# Patient Record
Sex: Female | Born: 2005 | Race: Black or African American | Hispanic: No | Marital: Single | Smoking: Never smoker
Health system: Southern US, Community
[De-identification: ages and names within clinical notes are randomized; demographics above are authoritative.]

## PROBLEM LIST (undated history)

## (undated) DIAGNOSIS — D573 Sickle-cell trait: Secondary | ICD-10-CM

## (undated) DIAGNOSIS — Z789 Other specified health status: Secondary | ICD-10-CM

## (undated) HISTORY — PX: OTHER SURGICAL HISTORY: SHX169

## (undated) HISTORY — DX: Sickle-cell trait: D57.3

## (undated) HISTORY — DX: Other specified health status: Z78.9

---
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
## 2023-04-04 DIAGNOSIS — Z113 Encounter for screening for infections with a predominantly sexual mode of transmission: Secondary | ICD-10-CM | POA: Diagnosis not present

## 2023-04-18 DIAGNOSIS — Z111 Encounter for screening for respiratory tuberculosis: Secondary | ICD-10-CM | POA: Diagnosis not present

## 2023-04-20 DIAGNOSIS — Z111 Encounter for screening for respiratory tuberculosis: Secondary | ICD-10-CM | POA: Diagnosis not present

## 2023-05-18 DIAGNOSIS — Z113 Encounter for screening for infections with a predominantly sexual mode of transmission: Secondary | ICD-10-CM | POA: Diagnosis not present

## 2023-06-30 DIAGNOSIS — N899 Noninflammatory disorder of vagina, unspecified: Secondary | ICD-10-CM | POA: Diagnosis not present

## 2023-06-30 DIAGNOSIS — Z30431 Encounter for routine checking of intrauterine contraceptive device: Secondary | ICD-10-CM | POA: Diagnosis not present

## 2023-06-30 DIAGNOSIS — Z1152 Encounter for screening for COVID-19: Secondary | ICD-10-CM | POA: Diagnosis not present

## 2023-06-30 DIAGNOSIS — J00 Acute nasopharyngitis [common cold]: Secondary | ICD-10-CM | POA: Diagnosis not present

## 2023-08-24 DIAGNOSIS — Z113 Encounter for screening for infections with a predominantly sexual mode of transmission: Secondary | ICD-10-CM | POA: Diagnosis not present

## 2023-09-11 ENCOUNTER — Ambulatory Visit: Payer: Self-pay | Admitting: Family Medicine

## 2023-09-19 ENCOUNTER — Ambulatory Visit: Payer: Medicaid Other | Attending: Family Medicine | Admitting: Family Medicine

## 2023-09-19 ENCOUNTER — Other Ambulatory Visit (HOSPITAL_COMMUNITY)
Admission: RE | Admit: 2023-09-19 | Discharge: 2023-09-19 | Disposition: A | Discharge status: Home / Self Care | Payer: Medicaid Other | Source: Ambulatory Visit | Attending: Family Medicine | Admitting: Family Medicine

## 2023-09-19 ENCOUNTER — Encounter: Payer: Self-pay | Admitting: Family Medicine

## 2023-09-19 VITALS — BP 97/62 | HR 66 | Ht 65.0 in | Wt 154.0 lb

## 2023-09-19 DIAGNOSIS — Z13228 Encounter for screening for other metabolic disorders: Secondary | ICD-10-CM

## 2023-09-19 DIAGNOSIS — Z113 Encounter for screening for infections with a predominantly sexual mode of transmission: Secondary | ICD-10-CM

## 2023-09-19 DIAGNOSIS — R7989 Other specified abnormal findings of blood chemistry: Secondary | ICD-10-CM | POA: Diagnosis not present

## 2023-09-19 DIAGNOSIS — Z0000 Encounter for general adult medical examination without abnormal findings: Secondary | ICD-10-CM

## 2023-09-19 NOTE — Progress Notes (Signed)
Subjective:  Patient ID: Madison Tyler, female    DOB: [birth year]  Age: 18 y.o. MRN: [id number]  CC: Annual Exam   HPI Madison Tyler is a 18 y.o. year old female here for an annual physical exam.  Interval History: Discussed the use of AI scribe software for clinical note transcription with the patient, who gave verbal consent to proceed.   She reports no major concerns and is trying to maintain a healthy lifestyle by eating a balanced diet and exercising regularly. She recently recovered from an upper respiratory infection, which is still causing a runny nose. She denies any other symptoms and has no new complaints. She has been regularly seeing a dentist and an optometrist, with the most recent visits in December and November, respectively.        Past Medical History:  Diagnosis Date   No pertinent past medical history     Past Surgical History:  Procedure Laterality Date   NECK CYST      No family history on file.  Social History   Socioeconomic History   Marital status: Single    Spouse name: Not on file   Number of children: Not on file   Years of education: Not on file   Highest education level: Not on file  Occupational History   Not on file  Tobacco Use   Smoking status: Never   Smokeless tobacco: Never  Vaping Use   Vaping status: Never Used  Substance and Sexual Activity   Alcohol use: Never   Drug use: Never   Sexual activity: Yes    Birth control/protection: I.U.D.  Other Topics Concern   Not on file  Social History Narrative   Not on file   Social Drivers of Health   Financial Resource Strain: Not on file  Food Insecurity: No Food Insecurity (02/08/2022)   Hunger Vital Sign    Worried About Running Out of Food in the Last Year: Never true    Ran Out of Food in the Last Year: Never true  Transportation Needs: No Transportation Needs (02/08/2022)   PRAPARE - Administrator, Civil Service (Medical): No    Lack of Transportation  (Non-Medical): No  Physical Activity: Not on file  Stress: Not on file  Social Connections: Not on file    No Known Allergies  Outpatient Medications Prior to Visit  Medication Sig Dispense Refill   doxycycline (VIBRA-TABS) 100 MG tablet Take 1 tablet (100 mg total) by mouth 2 (two) times daily. 14 tablet 0   Facility-Administered Medications Prior to Visit  Medication Dose Route Frequency Provider Last Rate Last Admin   ibuprofen (ADVIL) tablet 800 mg  800 mg Oral Once Worthy Rancher, MD         ROS Review of Systems  Constitutional:  Negative for activity change and appetite change.  HENT:  Negative for sinus pressure and sore throat.   Respiratory:  Negative for chest tightness, shortness of breath and wheezing.   Cardiovascular:  Negative for chest pain and palpitations.  Gastrointestinal:  Negative for abdominal distention, abdominal pain and constipation.  Genitourinary: Negative.   Musculoskeletal: Negative.   Psychiatric/Behavioral:  Negative for behavioral problems and dysphoric mood.     Objective:  BP 97/62   Pulse 66   Ht 5\' 5"  (1.651 m)   Wt 154 lb (69.9 kg)   SpO2 97%   BMI 25.63 kg/m      09/19/2023   10:15 AM 09/08/2022  8:47 AM 02/08/2022    4:17 PM  BP/Weight  Systolic BP 97 95 98  Diastolic BP 62 58 54  Wt. (Lbs) 154 158 150.4  BMI 25.63 kg/m2 26.29 kg/m2       Physical Exam Constitutional:      General: She is not in acute distress.    Appearance: She is well-developed. She is not diaphoretic.  HENT:     Head: Normocephalic.     Right Ear: External ear normal.     Left Ear: External ear normal.     Nose: Nose normal.     Mouth/Throat:     Mouth: Mucous membranes are moist.  Eyes:     Extraocular Movements: Extraocular movements intact.     Conjunctiva/sclera: Conjunctivae normal.     Pupils: Pupils are equal, round, and reactive to light.  Neck:     Vascular: No JVD.  Cardiovascular:     Rate and Rhythm: Normal rate and  regular rhythm.     Pulses: Normal pulses.     Heart sounds: Normal heart sounds. No murmur heard.    No gallop.  Pulmonary:     Effort: Pulmonary effort is normal. No respiratory distress.     Breath sounds: Normal breath sounds. No wheezing or rales.  Chest:     Chest wall: No tenderness.  Abdominal:     General: Bowel sounds are normal. There is no distension.     Palpations: Abdomen is soft. There is no mass.     Tenderness: There is no abdominal tenderness.  Musculoskeletal:        General: No tenderness. Normal range of motion.     Cervical back: Normal range of motion.  Skin:    General: Skin is warm and dry.  Neurological:     Mental Status: She is alert and oriented to person, place, and time.     Deep Tendon Reflexes: Reflexes are normal and symmetric.  Psychiatric:        Mood and Affect: Mood normal.        Latest Ref Rng & Units 09/08/2022    9:22 AM  CMP  Glucose 70 - 99 mg/dL 83   BUN 6 - 20 mg/dL 10   Creatinine 9.81 - 1.00 mg/dL 1.91   Sodium 478 - 295 mmol/L 140   Potassium 3.5 - 5.2 mmol/L 4.1   Chloride 96 - 106 mmol/L 102   CO2 20 - 29 mmol/L 23   Calcium 8.7 - 10.2 mg/dL 9.4   Total Protein 6.0 - 8.5 g/dL 7.1   Total Bilirubin 0.0 - 1.2 mg/dL 0.2   Alkaline Phos 42 - 106 IU/L 80   AST 0 - 40 IU/L 15   ALT 0 - 32 IU/L 5     Lipid Panel     Component Value Date/Time   CHOL 169 09/08/2022 0922   TRIG 44 09/08/2022 0922   HDL 73 09/08/2022 0922   LDLCALC 87 09/08/2022 0922    CBC    Component Value Date/Time   WBC 6.3 09/08/2022 0922   RBC 4.21 09/08/2022 0922   HGB 12.1 09/08/2022 0922   HCT 35.8 09/08/2022 0922   PLT 283 09/08/2022 0922   MCV 85 09/08/2022 0922   MCH 28.7 09/08/2022 0922   MCHC 33.8 09/08/2022 0922   RDW 13.2 09/08/2022 0922   LYMPHSABS 2.2 09/08/2022 0922   EOSABS 0.1 09/08/2022 0922   BASOSABS 0.0 09/08/2022 6213    Lab Results  Component Value  Date   HGBA1C 5.4 09/08/2022    Assessment & Plan:       Annual Physical Exam No major concerns reported. Normal physical examination. -Order routine blood work to check kidney, liver, blood count, cholesterol, and diabetes screening. -Perform chlamydia screen. -Counseled on 150 minutes of exercise per week, healthy eating (including decreased daily intake of saturated fats, cholesterol, added sugars, sodium), routine healthcare maintenance.  General Health Maintenance -Declined flu shot. -Next annual physical exam in one year.          No orders of the defined types were placed in this encounter.   Follow-up: Return in about 1 year (around 09/18/2024) for CPE/ Preventive Health Exam.       Hoy Register, MD, FAAFP. [facility] [place], [place] [phone]   09/19/2023, 10:39 AM

## 2023-09-19 NOTE — Patient Instructions (Signed)

## 2023-09-20 ENCOUNTER — Other Ambulatory Visit: Payer: Self-pay | Admitting: Family Medicine

## 2023-09-20 DIAGNOSIS — R7989 Other specified abnormal findings of blood chemistry: Secondary | ICD-10-CM

## 2023-09-20 LAB — LP+NON-HDL CHOLESTEROL
Cholesterol, Total: 161 mg/dL (ref 100–169)
HDL: 70 mg/dL (ref >39)
LDL Chol Calc (NIH): 83 mg/dL (ref 0–109)
Total Non-HDL-Chol (LDL+VLDL): 91 mg/dL (ref 0–119)
Triglycerides: 36 mg/dL (ref 0–89)
VLDL Cholesterol Cal: 8 mg/dL (ref 5–40)

## 2023-09-20 LAB — CBC WITH DIFFERENTIAL/PLATELET
Basophils Absolute: 0 10*3/uL (ref 0.0–0.2)
Basos: 1 %
EOS (ABSOLUTE): 0.1 10*3/uL (ref 0.0–0.4)
Eos: 3 %
Hematocrit: 39.6 % (ref 34.0–46.6)
Hemoglobin: 12.7 g/dL (ref 11.1–15.9)
Immature Grans (Abs): 0 10*3/uL (ref 0.0–0.1)
Immature Granulocytes: 0 %
Lymphocytes Absolute: 2.2 10*3/uL (ref 0.7–3.1)
Lymphs: 44 %
MCH: 27.6 pg (ref 26.6–33.0)
MCHC: 32.1 g/dL (ref 31.5–35.7)
MCV: 86 fL (ref 79–97)
Monocytes Absolute: 0.4 10*3/uL (ref 0.1–0.9)
Monocytes: 7 %
Neutrophils Absolute: 2.2 10*3/uL (ref 1.4–7.0)
Neutrophils: 45 %
Platelets: 243 10*3/uL (ref 150–450)
RBC: 4.6 x10E6/uL (ref 3.77–5.28)
RDW: 13 % (ref 11.7–15.4)
WBC: 4.9 10*3/uL (ref 3.4–10.8)

## 2023-09-20 LAB — CMP14+EGFR
ALT: 146 [IU]/L — ABNORMAL HIGH (ref 0–32)
AST: 276 [IU]/L — ABNORMAL HIGH (ref 0–40)
Albumin: 4.7 g/dL (ref 4.0–5.0)
Alkaline Phosphatase: 80 [IU]/L (ref 42–106)
BUN/Creatinine Ratio: 9 (ref 9–23)
BUN: 8 mg/dL (ref 6–20)
Bilirubin Total: 0.5 mg/dL (ref 0.0–1.2)
CO2: 25 mmol/L (ref 20–29)
Calcium: 9.9 mg/dL (ref 8.7–10.2)
Chloride: 104 mmol/L (ref 96–106)
Creatinine, Ser: 0.88 mg/dL (ref 0.57–1.00)
Globulin, Total: 2.8 g/dL (ref 1.5–4.5)
Glucose: 81 mg/dL (ref 70–99)
Potassium: 4.3 mmol/L (ref 3.5–5.2)
Sodium: 142 mmol/L (ref 134–144)
Total Protein: 7.5 g/dL (ref 6.0–8.5)
eGFR: 97 mL/min/{1.73_m2} (ref >59)

## 2023-09-20 LAB — HEMOGLOBIN A1C
Est. average glucose Bld gHb Est-mCnc: 105 mg/dL
Hgb A1c MFr Bld: 5.3 % (ref 4.8–5.6)

## 2023-09-21 ENCOUNTER — Other Ambulatory Visit: Payer: Self-pay | Admitting: Family Medicine

## 2023-09-21 DIAGNOSIS — R7989 Other specified abnormal findings of blood chemistry: Secondary | ICD-10-CM

## 2023-09-21 LAB — CERVICOVAGINAL ANCILLARY ONLY
Bacterial Vaginitis (gardnerella): POSITIVE — AB
Candida Glabrata: NEGATIVE
Candida Vaginitis: NEGATIVE
Chlamydia: NEGATIVE
Comment: NEGATIVE
Comment: NEGATIVE
Comment: NEGATIVE
Comment: NEGATIVE
Comment: NEGATIVE
Comment: NORMAL
Neisseria Gonorrhea: NEGATIVE
Trichomonas: NEGATIVE

## 2023-09-22 ENCOUNTER — Other Ambulatory Visit: Payer: Self-pay | Admitting: Family Medicine

## 2023-09-22 LAB — ALPHA-1-ANTITRYPSIN: A-1 Antitrypsin: 151 mg/dL (ref 100–188)

## 2023-09-22 LAB — SPECIMEN STATUS REPORT

## 2023-09-22 LAB — MITOCHONDRIAL ANTIBODIES: Mitochondrial Ab: <20 U (ref 0.0–20.0)

## 2023-09-22 LAB — HEPATITIS A ANTIBODY, IGM: Hep A IgM: NEGATIVE

## 2023-09-22 LAB — HEPATITIS A ANTIBODY, TOTAL: hep A Total Ab: POSITIVE — AB

## 2023-09-22 LAB — CERULOPLASMIN: Ceruloplasmin: 28.8 mg/dL (ref 19.0–39.0)

## 2023-09-22 LAB — HEPATITIS B SURFACE ANTIGEN: Hepatitis B Surface Ag: NEGATIVE

## 2023-09-22 MED ORDER — METRONIDAZOLE 500 MG PO TABS
500.0000 mg | ORAL_TABLET | Freq: Two times a day (BID) | ORAL | 0 refills | Status: AC
Start: 2023-09-22 — End: 2023-09-29

## 2023-12-06 DIAGNOSIS — Z113 Encounter for screening for infections with a predominantly sexual mode of transmission: Secondary | ICD-10-CM | POA: Diagnosis not present

## 2023-12-06 DIAGNOSIS — J029 Acute pharyngitis, unspecified: Secondary | ICD-10-CM | POA: Diagnosis not present

## 2024-02-20 ENCOUNTER — Telehealth: Payer: Self-pay | Admitting: Family Medicine

## 2024-02-20 NOTE — Telephone Encounter (Signed)
 2nd attempt Contacted pt phone going to vm to confirmed appt

## 2024-02-20 NOTE — Telephone Encounter (Signed)
 Contacted pt both numbers on file left vm to confirmed appt

## 2024-02-21 ENCOUNTER — Other Ambulatory Visit (HOSPITAL_COMMUNITY)
Admission: RE | Admit: 2024-02-21 | Discharge: 2024-02-21 | Disposition: A | Discharge status: Home / Self Care | Source: Ambulatory Visit | Attending: Family Medicine | Admitting: Family Medicine

## 2024-02-21 ENCOUNTER — Encounter: Payer: Self-pay | Admitting: Family Medicine

## 2024-02-21 ENCOUNTER — Ambulatory Visit: Attending: Family Medicine | Admitting: Family Medicine

## 2024-02-21 VITALS — BP 105/67 | HR 55 | Ht 65.0 in | Wt 150.4 lb

## 2024-02-21 DIAGNOSIS — Z975 Presence of (intrauterine) contraceptive device: Secondary | ICD-10-CM | POA: Diagnosis not present

## 2024-02-21 DIAGNOSIS — R7989 Other specified abnormal findings of blood chemistry: Secondary | ICD-10-CM | POA: Diagnosis not present

## 2024-02-21 DIAGNOSIS — N760 Acute vaginitis: Secondary | ICD-10-CM | POA: Diagnosis not present

## 2024-02-21 DIAGNOSIS — R634 Abnormal weight loss: Secondary | ICD-10-CM

## 2024-02-21 DIAGNOSIS — B9689 Other specified bacterial agents as the cause of diseases classified elsewhere: Secondary | ICD-10-CM | POA: Insufficient documentation

## 2024-02-21 MED ORDER — METRONIDAZOLE 0.75 % VA GEL: VAGINAL | 6 refills | Status: AC

## 2024-02-21 NOTE — Patient Instructions (Signed)
 VISIT SUMMARY:  Today, we discussed your concerns about recurrent bacterial vaginosis (BV) since the insertion of your IUD, menstrual irregularities, recent weight loss, and slightly elevated liver enzymes. We also reviewed your general health maintenance.  YOUR PLAN:  -RECURRENT BACTERIAL VAGINOSIS (BV): Recurrent BV is a condition where there is an imbalance of bacteria in the vagina, often causing odor and discharge. This may be linked to your IUD. We will perform a swab test to check for BV, prescribe preventive treatment, and provide medication if the test is positive. You will also be referred to a gynecologist to evaluate your IUD-related symptoms.  -WEIGHT LOSS: You have lost 4 pounds since January, likely due to stress and irregular eating habits at school. Your recent lab results showed normal kidney function, slightly elevated liver enzymes, and normal cholesterol and thyroid function. We will repeat these tests and discuss potential causes of the elevated liver enzymes. Please set up MyChart for easier communication and follow-up.  -ELEVATED LIVER ENZYMES: Slightly elevated liver enzymes can be caused by various factors including alcohol consumption, fatty liver, hepatitis, or medication use. We will repeat the liver function tests and monitor your alcohol intake, advising moderation.  -GENERAL HEALTH MAINTENANCE: We will screen for yeast and fungal infections as part of your BV evaluation. Routine labs showed normal kidney function, normal cholesterol, and thyroid function. Please continue to maintain a healthy lifestyle.  INSTRUCTIONS:  Please follow up with the gynecologist for your IUD-related symptoms and set up MyChart for easier communication and follow-up. Repeat labs for kidney function, liver function, cholesterol, and thyroid levels will be ordered. Monitor your alcohol consumption and maintain a healthy diet to help manage stress and weight.

## 2024-02-21 NOTE — Progress Notes (Signed)
 Subjective:  Patient ID: Madison Tyler, female    DOB: March 11, 2006  Age: 18 y.o. MRN: [id number]  CC: Medical Management of Chronic Issues (Discuss trouble with IUD/Irregular periods)     Discussed the use of AI scribe software for clinical note transcription with the patient, who gave verbal consent to proceed.  History of Present Illness Madison Tyler is a 18 year old female who presents with concerns related to her IUD. She is accompanied by her mother.  She experiences recurrent bacterial vaginosis since the insertion of her IUD two to two and a half years ago, with frequent infections characterized by changes in odor and discharge. She typically undergoes treatment with medication for one to two weeks and uses suppositories monthly.  Menstrual irregularities have occurred since the IUD insertion, with lighter but longer periods and spotting a few days before or after menstruation. Her cycle remains regular in timing, occurring monthly. She also experiences new onset cramping with her menstrual cycle.  There is recent weight loss attributed to stress and irregular eating habits at school, noted by family and friends. Her mother requests a blood workup to ensure stable health. She has a known sickle cell trait.    Past Medical History:  Diagnosis Date   No pertinent past medical history     Past Surgical History:  Procedure Laterality Date   NECK CYST      No family history on file.  Social History   Socioeconomic History   Marital status: Single    Spouse name: Not on file   Number of children: Not on file   Years of education: Not on file   Highest education level: Not on file  Occupational History   Not on file  Tobacco Use   Smoking status: Never   Smokeless tobacco: Never  Vaping Use   Vaping status: Never Used  Substance and Sexual Activity   Alcohol use: Never   Drug use: Never   Sexual activity: Yes    Birth control/protection: I.U.D.  Other Topics  Concern   Not on file  Social History Narrative   Not on file   Social Drivers of Health   Financial Resource Strain: Not on file  Food Insecurity: No Food Insecurity (02/08/2022)   Hunger Vital Sign    Worried About Running Out of Food in the Last Year: Never true    Ran Out of Food in the Last Year: Never true  Transportation Needs: No Transportation Needs (02/08/2022)   PRAPARE - Administrator, Civil Service (Medical): No    Lack of Transportation (Non-Medical): No  Physical Activity: Not on file  Stress: Not on file  Social Connections: Not on file    No Known Allergies  Outpatient Medications Prior to Visit  Medication Sig Dispense Refill   doxycycline  (VIBRA -TABS) 100 MG tablet Take 1 tablet (100 mg total) by mouth 2 (two) times daily. (Patient not taking: Reported on 02/21/2024) 14 tablet 0   Facility-Administered Medications Prior to Visit  Medication Dose Route Frequency Provider Last Rate Last Admin   ibuprofen  (ADVIL ) tablet 800 mg  800 mg Oral Once Clem Tawni HERO, MD         ROS Review of Systems  Constitutional:  Negative for activity change and appetite change.  HENT:  Negative for sinus pressure and sore throat.   Respiratory:  Negative for chest tightness, shortness of breath and wheezing.   Cardiovascular:  Negative for chest pain and palpitations.  Gastrointestinal:  Negative for abdominal distention, abdominal pain and constipation.  Genitourinary:  Positive for menstrual problem.  Musculoskeletal: Negative.   Psychiatric/Behavioral:  Negative for behavioral problems and dysphoric mood.     Objective:  BP 105/67   Pulse (!) 55   Ht 5' 5 (1.651 m)   Wt 150 lb 6.4 oz (68.2 kg)   LMP 02/13/2024   SpO2 99%   BMI 25.03 kg/m      02/21/2024    2:01 PM 09/19/2023   10:15 AM 09/08/2022    8:47 AM  BP/Weight  Systolic BP 105 97 95  Diastolic BP 67 62 58  Wt. (Lbs) 150.4 154 158  BMI 25.03 kg/m2 25.63 kg/m2 26.29 kg/m2       Physical Exam Constitutional:      Appearance: She is well-developed.  Cardiovascular:     Rate and Rhythm: Normal rate.     Heart sounds: Normal heart sounds. No murmur heard. Pulmonary:     Effort: Pulmonary effort is normal.     Breath sounds: Normal breath sounds. No wheezing or rales.  Chest:     Chest wall: No tenderness.  Abdominal:     General: Bowel sounds are normal. There is no distension.     Palpations: Abdomen is soft. There is no mass.     Tenderness: There is no abdominal tenderness.  Musculoskeletal:        General: Normal range of motion.     Right lower leg: No edema.     Left lower leg: No edema.  Neurological:     Mental Status: She is alert and oriented to person, place, and time.  Psychiatric:        Mood and Affect: Mood normal.        Latest Ref Rng & Units 09/19/2023   10:50 AM 09/08/2022    9:22 AM  CMP  Glucose 70 - 99 mg/dL 81  83   BUN 6 - 20 mg/dL 8  10   Creatinine 9.42 - 1.00 mg/dL 9.11  8.99   Sodium 865 - 144 mmol/L 142  140   Potassium 3.5 - 5.2 mmol/L 4.3  4.1   Chloride 96 - 106 mmol/L 104  102   CO2 20 - 29 mmol/L 25  23   Calcium 8.7 - 10.2 mg/dL 9.9  9.4   Total Protein 6.0 - 8.5 g/dL 7.5  7.1   Total Bilirubin 0.0 - 1.2 mg/dL 0.5  0.2   Alkaline Phos 42 - 106 IU/L 80  80   AST 0 - 40 IU/L 276  15   ALT 0 - 32 IU/L 146  5     Lipid Panel     Component Value Date/Time   CHOL 161 09/19/2023 1050   TRIG 36 09/19/2023 1050   HDL 70 09/19/2023 1050   LDLCALC 83 09/19/2023 1050    CBC    Component Value Date/Time   WBC 4.9 09/19/2023 1050   RBC 4.60 09/19/2023 1050   HGB 12.7 09/19/2023 1050   HCT 39.6 09/19/2023 1050   PLT 243 09/19/2023 1050   MCV 86 09/19/2023 1050   MCH 27.6 09/19/2023 1050   MCHC 32.1 09/19/2023 1050   RDW 13.0 09/19/2023 1050   LYMPHSABS 2.2 09/19/2023 1050   EOSABS 0.1 09/19/2023 1050   BASOSABS 0.0 09/19/2023 1050    Lab Results  Component Value Date   HGBA1C 5.3 09/19/2023     No results found for: TSH     Assessment &  Plan 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 Previous treatments included medication, yogurt, and suppositories. Uncertain about IUD removal due to convenience and contraceptive needs. Preventive treatment plan suggested with gynecology referral. - Perform wet prep swab for BV infection. - Prescribe prophylactic BV treatment. - Provide prescription for active BV treatment if swab positive. - Refer to gynecology for IUD-related symptoms evaluation.  Weight Loss Weight loss of 4 pounds since January, current weight 150 pounds. Labs showed normal kidney function, slightly elevated liver enzymes, normal cholesterol, and thyroid function. Stress and poor eating habits during school may contribute. Sickle cell trait present, not a concern unless partner also has the trait. - Order repeat labs for kidney function, liver function, cholesterol, and thyroid levels. - Discuss potential causes of elevated liver enzymes. - Advise on setting up MyChart for communication and follow-up.  Elevated Liver Enzymes Slightly elevated liver enzymes in January with no identified cause. Potential causes include alcohol consumption, fatty liver, hepatitis, and medication use. Reduced alcohol intake after admission of consumption. Previous tests for autoimmune disorders and hepatitis negative. -Patient endorses alcohol consumption - Repeat liver function tests. - Monitor alcohol consumption and advise moderation.  General Health Maintenance Plans to screen for yeast and fungal infections as part of BV evaluation. - Perform wet prep to screen for yeast and fungal infections.    Meds ordered this encounter  Medications   metroNIDAZOLE  (METROGEL ) 0.75 % vaginal gel    Sig: Insert twice a week for 6 months    Dispense:  70 g    Refill:  6    Follow-up: Return for previously scheduled appointment.        Corrina Sabin, MD, FAAFP. [facility] [place], [place] [phone]   02/21/2024, 2:38 PM

## 2024-02-22 ENCOUNTER — Ambulatory Visit: Payer: Self-pay | Admitting: Family Medicine

## 2024-02-22 LAB — CERVICOVAGINAL ANCILLARY ONLY
Bacterial Vaginitis (gardnerella): NEGATIVE
Candida Glabrata: NEGATIVE
Candida Vaginitis: NEGATIVE
Chlamydia: NEGATIVE
Comment: NEGATIVE
Comment: NEGATIVE
Comment: NEGATIVE
Comment: NEGATIVE
Comment: NEGATIVE
Comment: NORMAL
Neisseria Gonorrhea: NEGATIVE
Trichomonas: NEGATIVE

## 2024-02-22 LAB — CBC WITH DIFFERENTIAL/PLATELET
Basophils Absolute: 0 10*3/uL (ref 0.0–0.2)
Basos: 1 %
EOS (ABSOLUTE): 0.1 10*3/uL (ref 0.0–0.4)
Eos: 1 %
Hematocrit: 40.8 % (ref 34.0–46.6)
Hemoglobin: 13.2 g/dL (ref 11.1–15.9)
Immature Grans (Abs): 0 10*3/uL (ref 0.0–0.1)
Immature Granulocytes: 0 %
Lymphocytes Absolute: 2.1 10*3/uL (ref 0.7–3.1)
Lymphs: 36 %
MCH: 28.9 pg (ref 26.6–33.0)
MCHC: 32.4 g/dL (ref 31.5–35.7)
MCV: 89 fL (ref 79–97)
Monocytes Absolute: 0.4 10*3/uL (ref 0.1–0.9)
Monocytes: 7 %
Neutrophils Absolute: 3.2 10*3/uL (ref 1.4–7.0)
Neutrophils: 55 %
Platelets: 268 10*3/uL (ref 150–450)
RBC: 4.57 x10E6/uL (ref 3.77–5.28)
RDW: 12.9 % (ref 11.7–15.4)
WBC: 5.8 10*3/uL (ref 3.4–10.8)

## 2024-02-22 LAB — TSH: TSH: 1.17 u[IU]/mL (ref 0.450–4.500)

## 2024-02-22 LAB — LP+NON-HDL CHOLESTEROL
Cholesterol, Total: 184 mg/dL — ABNORMAL HIGH (ref 100–169)
HDL: 82 mg/dL (ref >39)
LDL Chol Calc (NIH): 92 mg/dL (ref 0–109)
Total Non-HDL-Chol (LDL+VLDL): 102 mg/dL (ref 0–119)
Triglycerides: 48 mg/dL (ref 0–89)
VLDL Cholesterol Cal: 10 mg/dL (ref 5–40)

## 2024-02-22 LAB — CMP14+EGFR
ALT: 9 IU/L (ref 0–32)
AST: 17 IU/L (ref 0–40)
Albumin: 4.8 g/dL (ref 4.0–5.0)
Alkaline Phosphatase: 76 IU/L (ref 42–106)
BUN/Creatinine Ratio: 14 (ref 9–23)
BUN: 12 mg/dL (ref 6–20)
Bilirubin Total: 0.6 mg/dL (ref 0.0–1.2)
CO2: 23 mmol/L (ref 20–29)
Calcium: 10.8 mg/dL — ABNORMAL HIGH (ref 8.7–10.2)
Chloride: 100 mmol/L (ref 96–106)
Creatinine, Ser: 0.87 mg/dL (ref 0.57–1.00)
Globulin, Total: 2.9 g/dL (ref 1.5–4.5)
Glucose: 78 mg/dL (ref 70–99)
Potassium: 4.3 mmol/L (ref 3.5–5.2)
Sodium: 137 mmol/L (ref 134–144)
Total Protein: 7.7 g/dL (ref 6.0–8.5)
eGFR: 98 mL/min/{1.73_m2} (ref >59)

## 2024-02-22 LAB — T4, FREE: Free T4: 1.24 ng/dL (ref 0.93–1.60)

## 2024-02-22 LAB — T3: T3, Total: 99 ng/dL (ref 71–180)

## 2024-02-22 LAB — GAMMA GT: GGT: 11 IU/L (ref 0–60)

## 2024-05-08 ENCOUNTER — Encounter: Payer: Self-pay | Admitting: Radiology

## 2024-05-08 ENCOUNTER — Ambulatory Visit (INDEPENDENT_AMBULATORY_CARE_PROVIDER_SITE_OTHER): Payer: Self-pay | Admitting: Radiology

## 2024-05-08 VITALS — BP 112/56 | Ht 66.0 in | Wt 151.0 lb

## 2024-05-08 DIAGNOSIS — Z30018 Encounter for initial prescription of other contraceptives: Secondary | ICD-10-CM

## 2024-05-08 DIAGNOSIS — Z975 Presence of (intrauterine) contraceptive device: Secondary | ICD-10-CM

## 2024-05-08 DIAGNOSIS — N760 Acute vaginitis: Secondary | ICD-10-CM

## 2024-05-08 MED ORDER — FLUCONAZOLE 150 MG PO TABS: 150.0000 mg | ORAL_TABLET | ORAL | 0 refills | Status: AC

## 2024-05-08 MED ORDER — PHEXXI 1.8-1-0.4 % VA GEL: 5.0000 g | VAGINAL | 11 refills | Status: AC

## 2024-05-08 NOTE — Progress Notes (Signed)
      Subjective: Madison Tyler is a 18 y.o. female who complains of recurrent BV. Symptoms intermittent since Mirena  insertion 2023 (per patient). Now with vaginal discharge, odor, no itching.    Review of Systems  All other systems reviewed and are negative.   Past Medical History:  Diagnosis Date   No pertinent past medical history    Sickle cell trait (HCC)       Objective:  Today's Vitals   05/08/24 1118  BP: (!) 112/56  Weight: 151 lb (68.5 kg)  Height: 5' 6 (1.676 m)   Body mass index is 24.37 kg/m.   Physical Exam Vitals and nursing note reviewed. Exam conducted with a chaperone present.  Constitutional:      Appearance: Normal appearance. She is well-developed.  Pulmonary:     Effort: Pulmonary effort is normal.  Abdominal:     General: Abdomen is flat.     Palpations: Abdomen is soft.  Genitourinary:    General: Normal vulva.     Vagina: Vaginal discharge present. No erythema, bleeding or lesions.     Cervix: Normal. No discharge, friability, lesion or erythema.     Uterus: Normal.      Adnexa: Right adnexa normal and left adnexa normal.  Neurological:     Mental Status: She is alert.  Psychiatric:        Mood and Affect: Mood normal.        Thought Content: Thought content normal.        Judgment: Judgment normal.     Microscopic wet-mount exam shows hyphae.   Darice Hoit, CMA present for exam  Assessment:/Plan:  1. Recurrent vaginitis (Primary) - WET PREP FOR TRICH, YEAST, CLUE - SureSwab Mycoplasma/Ureaplasma, PCR - fluconazole  (DIFLUCAN ) 150 MG tablet; Take 1 tablet (150 mg total) by mouth every 3 (three) days.  Dispense: 2 tablet; Refill: 0  2. IUD (intrauterine device) in place  3. Encounter for initial prescription of other contraceptives - Lactic Ac-Citric Ac-Pot Bitart (PHEXXI ) 1.8-1-0.4 % GEL; Place 5 g vaginally as directed. Before intercourse  Dispense: 120 g; Refill: 11   Will contact patient with results of testing completed  today. Avoid intercourse until symptoms are resolved. Safe sex encouraged. Avoid the use of soaps or perfumed products in the peri area. Avoid tub baths and sitting in sweaty or wet clothing for prolonged periods of time.    Olita Takeshita B, NP 11:36 AM

## 2024-05-09 LAB — WET PREP FOR TRICH, YEAST, CLUE

## 2024-05-12 LAB — SURESWAB MYCOPLASMA/UREAPLASMA, PCR
M. hominis DNA: NOT DETECTED
MYCOPLASMA GENITALIUM, rRNA,TMA: NOT DETECTED
U. parvum DNA: NOT DETECTED
U. urealyticum DNA: NOT DETECTED

## 2024-05-14 ENCOUNTER — Ambulatory Visit: Payer: Self-pay | Admitting: Radiology

## 2024-09-17 ENCOUNTER — Telehealth: Payer: Self-pay | Admitting: Family Medicine

## 2024-09-17 NOTE — Telephone Encounter (Signed)
 Contacted pt left vm to confirmed appt

## 2024-09-18 ENCOUNTER — Ambulatory Visit: Payer: Medicaid Other | Attending: Family Medicine | Admitting: Family Medicine

## 2024-09-18 ENCOUNTER — Encounter: Payer: Self-pay | Admitting: Family Medicine

## 2024-09-18 VITALS — BP 103/66 | HR 79 | Temp 98.5°F | Ht 66.0 in | Wt 150.4 lb

## 2024-09-18 DIAGNOSIS — Z13220 Encounter for screening for lipoid disorders: Secondary | ICD-10-CM

## 2024-09-18 DIAGNOSIS — Z0001 Encounter for general adult medical examination with abnormal findings: Secondary | ICD-10-CM

## 2024-09-18 DIAGNOSIS — R5383 Other fatigue: Secondary | ICD-10-CM

## 2024-09-18 DIAGNOSIS — Z131 Encounter for screening for diabetes mellitus: Secondary | ICD-10-CM

## 2024-09-18 DIAGNOSIS — G4700 Insomnia, unspecified: Secondary | ICD-10-CM

## 2024-09-18 DIAGNOSIS — Z13228 Encounter for screening for other metabolic disorders: Secondary | ICD-10-CM

## 2024-09-18 MED ORDER — DICLOFENAC SODIUM 1 % EX GEL: 4.0000 g | Freq: Four times a day (QID) | CUTANEOUS | 1 refills | Status: AC

## 2024-09-18 NOTE — Progress Notes (Signed)
 "  Subjective:  Patient ID: Madison Tyler, female    DOB: Jul 23, 2006  Age: 19 y.o. MRN: [id number]  CC: Annual Exam (Right jaw pain X1 week.)     Discussed the use of AI scribe software for clinical note transcription with the patient, who gave verbal consent to proceed.  History of Present Illness Madison Tyler is a 19 year old female who presents for an annual physical exam. She sees a dentist regularly and recently had an ophthalmology visit.  She has had R jaw pain for a few days, worse when opening her mouth past a certain point, without a clicking sound on side-to-side movement. She denies jaw trauma.  She has difficulty falling asleep due to racing thoughts and feels low energy and sluggish despite what seems like adequate sleep. She does not drink soda or coffee, has not tried melatonin, and does not exercise regularly.  She previously had elevated liver enzymes in January that normalized on repeat testing in July, in the setting of alcohol use at that time.  Her family history includes a possible case of diabetes in her grandfather.  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    Past Medical History:  Diagnosis Date   No pertinent past medical history    Sickle cell trait     Past Surgical History:  Procedure Laterality Date   NECK CYST      No family history on file.  Social History   Socioeconomic History   Marital status: Single    Spouse name: Not on file   Number of children: Not on file   Years of education: Not on file   Highest education level: Not on file  Occupational History   Not on file  Tobacco Use   Smoking status: Never    Passive exposure: Never   Smokeless tobacco: Never  Vaping Use   Vaping status: Never Used  Substance and Sexual Activity   Alcohol use: Yes    Alcohol/week: 3.0 standard drinks of alcohol    Types: 3 Standard drinks or equivalent per week   Drug use: Never    Sexual activity: Not Currently    Partners: Male    Birth control/protection: I.U.D.    Comment: menarche 19yo, sexual debut 19yo  Other Topics Concern   Not on file  Social History Narrative   Not on file   Social Drivers of Health   Tobacco Use: Low Risk (07/12/2024)   Received from [facility]   Patient History    Smoking Tobacco Use: Never    Smokeless Tobacco Use: Never    Passive Exposure: Not on file  Financial Resource Strain: Not on file  Food Insecurity: No Food Insecurity (02/08/2022)   Hunger Vital Sign    Worried About Running Out of Food in the Last Year: Never true    Ran Out of Food in the Last Year: Never true  Transportation Needs: No Transportation Needs (02/08/2022)   PRAPARE - Administrator, Civil Service (Medical): No    Lack of Transportation (Non-Medical): No  Physical Activity: Not on file  Stress: Not on file  Social Connections: Not on file  Depression (EYV7-0): Medium Risk (09/18/2024)   Depression (PHQ2-9)    PHQ-2 Score: 5  Alcohol Screen: Not on file  Housing: Not on file  Utilities: Not on file  Health Literacy: Not on file    Allergies[1]  Outpatient Medications Prior to Visit  Medication  Sig Dispense Refill   doxycycline  (VIBRA -TABS) 100 MG tablet Take 1 tablet (100 mg total) by mouth 2 (two) times daily. (Patient not taking: Reported on 09/18/2024) 14 tablet 0   fluconazole  (DIFLUCAN ) 150 MG tablet Take 1 tablet (150 mg total) by mouth every 3 (three) days. (Patient not taking: Reported on 09/18/2024) 2 tablet 0   Lactic Ac-Citric Ac-Pot Bitart (PHEXXI ) 1.8-1-0.4 % GEL Place 5 g vaginally as directed. Before intercourse (Patient not taking: Reported on 09/18/2024) 120 g 11   metroNIDAZOLE  (METROGEL ) 0.75 % vaginal gel Insert twice a week for 6 months (Patient not taking: Reported on 09/18/2024) 70 g 6   ibuprofen  (ADVIL ) tablet 800 mg      No facility-administered medications prior to visit.     ROS Review of Systems   Constitutional:  Negative for activity change and appetite change.  HENT:  Negative for sinus pressure and sore throat.   Respiratory:  Negative for chest tightness, shortness of breath and wheezing.   Cardiovascular:  Negative for chest pain and palpitations.  Gastrointestinal:  Negative for abdominal distention, abdominal pain and constipation.  Genitourinary: Negative.   Musculoskeletal: Negative.   Psychiatric/Behavioral:  Negative for behavioral problems and dysphoric mood.     Objective:  BP 103/66   Pulse 79   Temp 98.5 F (36.9 C) (Oral)   Ht 5' 6 (1.676 m)   Wt 150 lb 6.4 oz (68.2 kg)   SpO2 98%   BMI 24.28 kg/m      09/18/2024   10:36 AM 05/08/2024   11:18 AM 02/21/2024    2:01 PM  BP/Weight  Systolic BP 103 112 105  Diastolic BP 66 56 67  Wt. (Lbs) 150.4 151 150.4  BMI 24.28 kg/m2 24.37 kg/m2 25.03 kg/m2      Physical Exam Constitutional:      General: She is not in acute distress.    Appearance: She is well-developed. She is not diaphoretic.  HENT:     Head: Normocephalic.     Comments: No TTP of both jaws or TMJ and no clicking heard    Right Ear: External ear normal.     Left Ear: External ear normal.     Nose: Nose normal.     Mouth/Throat:     Mouth: Mucous membranes are moist.  Eyes:     Extraocular Movements: Extraocular movements intact.     Conjunctiva/sclera: Conjunctivae normal.     Pupils: Pupils are equal, round, and reactive to light.  Neck:     Vascular: No JVD.  Cardiovascular:     Rate and Rhythm: Normal rate and regular rhythm.     Pulses: Normal pulses.     Heart sounds: Normal heart sounds. No murmur heard.    No gallop.  Pulmonary:     Effort: Pulmonary effort is normal. No respiratory distress.     Breath sounds: Normal breath sounds. No wheezing or rales.  Chest:     Chest wall: No tenderness.  Abdominal:     General: Bowel sounds are normal. There is no distension.     Palpations: Abdomen is soft. There is no mass.      Tenderness: There is no abdominal tenderness.  Musculoskeletal:        General: No tenderness. Normal range of motion.     Cervical back: Normal range of motion.  Skin:    General: Skin is warm and dry.  Neurological:     Mental Status: She is alert and oriented to  person, place, and time.     Deep Tendon Reflexes: Reflexes are normal and symmetric.  Psychiatric:        Mood and Affect: Mood normal.        Latest Ref Rng & Units 02/21/2024    2:49 PM 09/19/2023   10:50 AM 09/08/2022    9:22 AM  CMP  Glucose 70 - 99 mg/dL 78  81  83   BUN 6 - 20 mg/dL 12  8  10    Creatinine 0.57 - 1.00 mg/dL 9.12  9.11  8.99   Sodium 134 - 144 mmol/L 137  142  140   Potassium 3.5 - 5.2 mmol/L 4.3  4.3  4.1   Chloride 96 - 106 mmol/L 100  104  102   CO2 20 - 29 mmol/L 23  25  23    Calcium 8.7 - 10.2 mg/dL 89.1  9.9  9.4   Total Protein 6.0 - 8.5 g/dL 7.7  7.5  7.1   Total Bilirubin 0.0 - 1.2 mg/dL 0.6  0.5  0.2   Alkaline Phos 42 - 106 IU/L 76  80  80   AST 0 - 40 IU/L 17  276  15   ALT 0 - 32 IU/L 9  146  5     Lipid Panel     Component Value Date/Time   CHOL 184 (H) 02/21/2024 1449   TRIG 48 02/21/2024 1449   HDL 82 02/21/2024 1449   LDLCALC 92 02/21/2024 1449    CBC    Component Value Date/Time   WBC 5.8 02/21/2024 1449   RBC 4.57 02/21/2024 1449   HGB 13.2 02/21/2024 1449   HCT 40.8 02/21/2024 1449   PLT 268 02/21/2024 1449   MCV 89 02/21/2024 1449   MCH 28.9 02/21/2024 1449   MCHC 32.4 02/21/2024 1449   RDW 12.9 02/21/2024 1449   LYMPHSABS 2.1 02/21/2024 1449   EOSABS 0.1 02/21/2024 1449   BASOSABS 0.0 02/21/2024 1449    Lab Results  Component Value Date   HGBA1C 5.3 09/19/2023       Assessment & Plan Annual visit for adult general medical exam with abnormal findings/screening for metabolic disorder, diabetes, lipid for cardiovascular disease Annual wellness visit completed. Previous labs normal. No sexual activity reported. - Ordered labs for cholesterol,  diabetes, thyroid , and vitamin D  levels. - Scheduled next physical exam in one year.  Temporomandibular joint dysfunction Jaw pain with clicking likely due to TMJ dysfunction causing inflammation. - Prescribed Voltaren  gel for inflammation. - Provided information on TMJ dysfunction.  Sleep disturbance Difficulty falling asleep possibly due to anxiety. Discussed non-pharmacological interventions and advised against long-term medication use. - Recommended chamomile tea, yoga, and evening exercise. - Suggested over-the-counter melatonin.  Fatigue Low energy despite adequate sleep. Previous thyroid  function normal. Plan to check vitamin D  and screen for anemia. - Ordered vitamin D  level and anemia screening. - Recommended multivitamins. - Advised yoga for relaxation and energy improvement.      Meds ordered this encounter  Medications   diclofenac  Sodium (VOLTAREN ) 1 % GEL    Sig: Apply 4 g topically 4 (four) times daily.    Dispense:  100 g    Refill:  1    Follow-up: Return in about 1 year (around 09/18/2025) for CPE/ Preventive Health Exam.       Corrina Sabin, MD, FAAFP. [facility] [place], [place] [phone]   09/18/2024, 12:06 PM    [1] No Known Allergies  "

## 2024-09-18 NOTE — Patient Instructions (Signed)
 Temporomandibular Joint Syndrome  Temporomandibular joint syndrome (TMJ syndrome) is a condition that causes pain in the temporomandibular joints. These joints are located near your ears and allow your jaw to open and close. For people with TMJ syndrome, chewing, biting, or other movements of the jaw can be difficult or painful. TMJ syndrome is often mild and goes away within a few weeks. However, sometimes the condition becomes a long-term (chronic) problem. What are the causes? This condition may be caused by: Grinding your teeth or clenching your jaw. Some people do this when they are stressed. Arthritis. An injury to the jaw. A head or neck injury. Teeth or dentures that are not aligned well. In some cases, the cause of TMJ syndrome may not be known. What are the signs or symptoms? The most common symptom of this condition is aching pain on the side of the head in the area of the TMJ. Other symptoms may include: Pain when moving your jaw, such as when chewing or biting. Not being able to open your jaw all the way. Making a clicking sound when you open your mouth. Headache. Earache. Neck or shoulder pain. How is this diagnosed? This condition may be diagnosed based on: Your symptoms and medical history. A physical exam. Your health care provider may check the range of motion of your jaw. Imaging tests, such as X-rays or an MRI. You may also need to see your dentist, who will check if your teeth and jaw are lined up correctly. How is this treated? TMJ syndrome often goes away on its own. If treatment is needed, it may include: Eating soft foods and applying ice or heat. Medicines to relieve pain or inflammation. Medicines or massage to relax the muscles. A splint, bite plate, or mouthpiece to prevent teeth grinding or jaw clenching. Relaxation techniques or counseling to help reduce stress. A therapy for pain in which an electrical current is applied to the nerves through the skin  (transcutaneous electrical nerve stimulation). Acupuncture. This may help to relieve pain. Jaw surgery. This is rarely needed. Follow these instructions at home:  Eating and drinking Eat a soft diet if you are having trouble chewing. Avoid foods that require a lot of chewing. Do not chew gum. General instructions Take over-the-counter and prescription medicines only as told by your health care provider. If directed, put ice on the painful area. To do this: Put ice in a plastic bag. Place a towel between your skin and the bag. Leave the ice on for 20 minutes, 2-3 times a day. Remove the ice if your skin turns bright red. This is very important. If you cannot feel pain, heat, or cold, you have a greater risk of damage to the area. Apply a warm, wet cloth (warm compress) to the painful area as told. Massage your jaw area and do any jaw stretching exercises as told by your health care provider. If you were given a splint, bite plate, or mouthpiece, wear it as told by your health care provider. Keep all follow-up visits. This is important. Where to find more information [employer]: [employer] Contact a health care provider if: You have trouble eating. You have new or worsening symptoms. Get help right away if: Your jaw locks. Summary Temporomandibular joint syndrome (TMJ syndrome) is a condition that causes pain in the temporomandibular joints. These joints are located near your ears and allow your jaw to open and close. TMJ syndrome is often mild and goes away within  a few weeks. However, sometimes the condition becomes a long-term (chronic) problem. Symptoms include an aching pain on the side of the head in the area of the TMJ, pain when chewing or biting, and being unable to open your jaw all the way. You may also make a clicking sound when you open your mouth. TMJ syndrome often goes away on its own. If treatment is needed, it may  include medicines to relieve pain, reduce inflammation, or relax the muscles. A splint, bite plate, or mouthpiece may also be used to prevent teeth grinding or jaw clenching. This information is not intended to replace advice given to you by your health care provider. Make sure you discuss any questions you have with your health care provider. Document Revised: 03/21/2021 Document Reviewed: 03/21/2021 Elsevier Patient Education  2024 [employer].

## 2024-09-19 ENCOUNTER — Ambulatory Visit: Payer: Self-pay | Admitting: Family Medicine

## 2024-09-19 LAB — CMP14+EGFR
ALT: 11 [IU]/L (ref 0–32)
AST: 18 [IU]/L (ref 0–40)
Albumin: 4.9 g/dL (ref 4.0–5.0)
Alkaline Phosphatase: 92 [IU]/L (ref 42–106)
BUN/Creatinine Ratio: 15 (ref 9–23)
BUN: 15 mg/dL (ref 6–20)
Bilirubin Total: 0.4 mg/dL (ref 0.0–1.2)
CO2: 22 mmol/L (ref 20–29)
Calcium: 10.4 mg/dL — ABNORMAL HIGH (ref 8.7–10.2)
Chloride: 103 mmol/L (ref 96–106)
Creatinine, Ser: 1.01 mg/dL — ABNORMAL HIGH (ref 0.57–1.00)
Globulin, Total: 2.7 g/dL (ref 1.5–4.5)
Glucose: 75 mg/dL (ref 70–99)
Potassium: 4.9 mmol/L (ref 3.5–5.2)
Sodium: 139 mmol/L (ref 134–144)
Total Protein: 7.6 g/dL (ref 6.0–8.5)
eGFR: 82 mL/min/{1.73_m2}

## 2024-09-19 LAB — CBC WITH DIFFERENTIAL/PLATELET
Basophils Absolute: 0.1 10*3/uL (ref 0.0–0.2)
Basos: 1 %
EOS (ABSOLUTE): 0.1 10*3/uL (ref 0.0–0.4)
Eos: 1 %
Hematocrit: 42.2 % (ref 34.0–46.6)
Hemoglobin: 13.7 g/dL (ref 11.1–15.9)
Immature Grans (Abs): 0 10*3/uL (ref 0.0–0.1)
Immature Granulocytes: 0 %
Lymphocytes Absolute: 2 10*3/uL (ref 0.7–3.1)
Lymphs: 30 %
MCH: 28.2 pg (ref 26.6–33.0)
MCHC: 32.5 g/dL (ref 31.5–35.7)
MCV: 87 fL (ref 79–97)
Monocytes Absolute: 0.4 10*3/uL (ref 0.1–0.9)
Monocytes: 7 %
Neutrophils Absolute: 4.1 10*3/uL (ref 1.4–7.0)
Neutrophils: 61 %
Platelets: 256 10*3/uL (ref 150–450)
RBC: 4.85 x10E6/uL (ref 3.77–5.28)
RDW: 12.9 % (ref 11.7–15.4)
WBC: 6.7 10*3/uL (ref 3.4–10.8)

## 2024-09-19 LAB — LP+NON-HDL CHOLESTEROL
Cholesterol, Total: 181 mg/dL (ref 100–199)
HDL: 73 mg/dL
LDL Chol Calc (NIH): 99 mg/dL (ref 0–99)
Total Non-HDL-Chol (LDL+VLDL): 108 mg/dL (ref 0–129)
Triglycerides: 45 mg/dL (ref 0–149)
VLDL Cholesterol Cal: 9 mg/dL (ref 5–40)

## 2024-09-19 LAB — HEMOGLOBIN A1C
Est. average glucose Bld gHb Est-mCnc: 105 mg/dL
Hgb A1c MFr Bld: 5.3 % (ref 4.8–5.6)

## 2024-09-19 LAB — VITAMIN D 25 HYDROXY (VIT D DEFICIENCY, FRACTURES): Vit D, 25-Hydroxy: 14.2 ng/mL — ABNORMAL LOW (ref 30.0–100.0)

## 2024-09-19 MED ORDER — ERGOCALCIFEROL 1.25 MG (50000 UT) PO CAPS: 50000.0000 [IU] | ORAL_CAPSULE | ORAL | 1 refills | Status: AC

## 2025-09-22 ENCOUNTER — Ambulatory Visit: Payer: Self-pay | Admitting: Family Medicine
